# Patient Record
Sex: Male | Born: 1965 | Race: White | Hispanic: No | Marital: Single | Smoking: Current every day smoker
Health system: Southern US, Community
[De-identification: ages and names within clinical notes are randomized; demographics above are authoritative.]

## PROBLEM LIST (undated history)

## (undated) DIAGNOSIS — I10 Essential (primary) hypertension: Secondary | ICD-10-CM

## (undated) DIAGNOSIS — J449 Chronic obstructive pulmonary disease, unspecified: Secondary | ICD-10-CM

---

## 2018-11-23 ENCOUNTER — Inpatient Hospital Stay (HOSPITAL_COMMUNITY)
Admission: EM | Admit: 2018-11-23 | Discharge: 2018-11-26 | DRG: 193 | Disposition: A | Discharge status: Home / Self Care | Payer: Medicaid Other | Attending: Internal Medicine | Admitting: Internal Medicine

## 2018-11-23 ENCOUNTER — Other Ambulatory Visit: Payer: Self-pay

## 2018-11-23 ENCOUNTER — Emergency Department (HOSPITAL_COMMUNITY): Payer: Medicaid Other

## 2018-11-23 DIAGNOSIS — J209 Acute bronchitis, unspecified: Secondary | ICD-10-CM | POA: Diagnosis present

## 2018-11-23 DIAGNOSIS — J9601 Acute respiratory failure with hypoxia: Secondary | ICD-10-CM | POA: Diagnosis present

## 2018-11-23 DIAGNOSIS — R531 Weakness: Secondary | ICD-10-CM | POA: Diagnosis present

## 2018-11-23 DIAGNOSIS — R059 Cough, unspecified: Secondary | ICD-10-CM

## 2018-11-23 DIAGNOSIS — J111 Influenza due to unidentified influenza virus with other respiratory manifestations: Secondary | ICD-10-CM

## 2018-11-23 DIAGNOSIS — Z5900 Homelessness unspecified: Secondary | ICD-10-CM

## 2018-11-23 DIAGNOSIS — Z23 Encounter for immunization: Secondary | ICD-10-CM

## 2018-11-23 DIAGNOSIS — R05 Cough: Secondary | ICD-10-CM

## 2018-11-23 DIAGNOSIS — J101 Influenza due to other identified influenza virus with other respiratory manifestations: Principal | ICD-10-CM | POA: Diagnosis present

## 2018-11-23 DIAGNOSIS — I10 Essential (primary) hypertension: Secondary | ICD-10-CM | POA: Diagnosis present

## 2018-11-23 DIAGNOSIS — J441 Chronic obstructive pulmonary disease with (acute) exacerbation: Secondary | ICD-10-CM

## 2018-11-23 DIAGNOSIS — J449 Chronic obstructive pulmonary disease, unspecified: Secondary | ICD-10-CM | POA: Diagnosis present

## 2018-11-23 DIAGNOSIS — D72819 Decreased white blood cell count, unspecified: Secondary | ICD-10-CM | POA: Diagnosis present

## 2018-11-23 DIAGNOSIS — R740 Nonspecific elevation of levels of transaminase and lactic acid dehydrogenase [LDH]: Secondary | ICD-10-CM | POA: Diagnosis present

## 2018-11-23 DIAGNOSIS — J440 Chronic obstructive pulmonary disease with acute lower respiratory infection: Secondary | ICD-10-CM | POA: Diagnosis present

## 2018-11-23 DIAGNOSIS — F1010 Alcohol abuse, uncomplicated: Secondary | ICD-10-CM | POA: Diagnosis present

## 2018-11-23 DIAGNOSIS — R262 Difficulty in walking, not elsewhere classified: Secondary | ICD-10-CM | POA: Diagnosis present

## 2018-11-23 DIAGNOSIS — Z720 Tobacco use: Secondary | ICD-10-CM

## 2018-11-23 DIAGNOSIS — D6959 Other secondary thrombocytopenia: Secondary | ICD-10-CM | POA: Diagnosis present

## 2018-11-23 HISTORY — DX: Chronic obstructive pulmonary disease, unspecified: J44.9

## 2018-11-23 HISTORY — DX: Essential (primary) hypertension: I10

## 2018-11-23 LAB — BASIC METABOLIC PANEL
ANION GAP: 14 (ref 5–15)
BUN: 18 mg/dL (ref 6–20)
CO2: 19 mmol/L — ABNORMAL LOW (ref 22–32)
Calcium: 8 mg/dL — ABNORMAL LOW (ref 8.9–10.3)
Chloride: 101 mmol/L (ref 98–111)
Creatinine, Ser: 1.03 mg/dL (ref 0.61–1.24)
GFR calc non Af Amer: >60 mL/min (ref >60)
Glucose, Bld: 102 mg/dL — ABNORMAL HIGH (ref 70–99)
Potassium: 3.6 mmol/L (ref 3.5–5.1)
SODIUM: 134 mmol/L — AB (ref 135–145)

## 2018-11-23 LAB — POCT I-STAT EG7
Acid-base deficit: 2 mmol/L (ref 0.0–2.0)
Bicarbonate: 23 mmol/L (ref 20.0–28.0)
Calcium, Ion: 1.05 mmol/L — ABNORMAL LOW (ref 1.15–1.40)
HCT: 36 % — ABNORMAL LOW (ref 39.0–52.0)
Hemoglobin: 12.2 g/dL — ABNORMAL LOW (ref 13.0–17.0)
O2 Saturation: 93 %
PH VEN: 7.367 (ref 7.250–7.430)
Potassium: 3.4 mmol/L — ABNORMAL LOW (ref 3.5–5.1)
Sodium: 136 mmol/L (ref 135–145)
TCO2: 24 mmol/L (ref 22–32)
pCO2, Ven: 40 mmHg — ABNORMAL LOW (ref 44.0–60.0)
pO2, Ven: 69 mmHg — ABNORMAL HIGH (ref 32.0–45.0)

## 2018-11-23 LAB — CBC
HCT: 38.3 % — ABNORMAL LOW (ref 39.0–52.0)
Hemoglobin: 12.4 g/dL — ABNORMAL LOW (ref 13.0–17.0)
MCH: 28.9 pg (ref 26.0–34.0)
MCHC: 32.4 g/dL (ref 30.0–36.0)
MCV: 89.3 fL (ref 80.0–100.0)
NRBC: 0 % (ref 0.0–0.2)
Platelets: 87 10*3/uL — ABNORMAL LOW (ref 150–400)
RBC: 4.29 MIL/uL (ref 4.22–5.81)
RDW: 14.8 % (ref 11.5–15.5)
WBC: 3.3 10*3/uL — AB (ref 4.0–10.5)

## 2018-11-23 MED ORDER — IPRATROPIUM-ALBUTEROL 0.5-2.5 (3) MG/3ML IN SOLN
3.0000 mL | Freq: Once | RESPIRATORY_TRACT | Status: AC
  Administered 2018-11-23: 3 mL via RESPIRATORY_TRACT
  Filled 2018-11-23: qty 3

## 2018-11-23 MED ORDER — PREDNISONE 20 MG PO TABS
60.0000 mg | ORAL_TABLET | Freq: Once | ORAL | Status: AC
  Administered 2018-11-23: 60 mg via ORAL
  Filled 2018-11-23: qty 3

## 2018-11-23 NOTE — ED Notes (Signed)
Bed: JK93 Expected date:  Expected time:  Means of arrival:  Comments: 32M Fever, flu symptoms

## 2018-11-23 NOTE — ED Triage Notes (Signed)
Pt has been at home for the past 3 days with complaints of head congestion, body aches, and cough.  Pt was running a fever of 102 at home.  EMS gave 1000mg  of tylenol

## 2018-11-23 NOTE — ED Provider Notes (Signed)
[facility] Emergency Department Provider Note MRN:  [id number]  Arrival date & time: 11/23/18     Chief Complaint   Influenza   History of Present Illness   Joseph Powers is a 53 y.o. year-old male with history of COPD presenting to the ED with chief complaint of influenza.  Patient reports 1 week of nasal congestion, mild sore throat, diffuse body aches, fever, chills, persistent cough.  Was feeling better yesterday, symptoms returned today, increased shortness of breath, chest soreness with cough.  Denies headache or vision change, no abdominal pain, no vomiting, no diarrhea.  Symptoms are constant, moderate, no exacerbating relieving factors.  Review of Systems  A complete 10 system review of systems was obtained and all systems are negative except as noted in the HPI and PMH.   Patient's Health History   Past medical history: COPD Social history: Smokes cigarettes No family history on file.  Social History   Socioeconomic History  . Marital status: Single    Spouse name: Not on file  . Number of children: Not on file  . Years of education: Not on file  . Highest education level: Not on file  Occupational History  . Not on file  Social Needs  . Financial resource strain: Not on file  . Food insecurity:    Worry: Not on file    Inability: Not on file  . Transportation needs:    Medical: Not on file    Non-medical: Not on file  Tobacco Use  . Smoking status: Not on file  Substance and Sexual Activity  . Alcohol use: Not on file  . Drug use: Not on file  . Sexual activity: Not on file  Lifestyle  . Physical activity:    Days per week: Not on file    Minutes per session: Not on file  . Stress: Not on file  Relationships  . Social connections:    Talks on phone: Not on file    Gets together: Not on file    Attends religious service: Not on file    Active member of club or organization: Not on file    Attends meetings of clubs or  organizations: Not on file    Relationship status: Not on file  . Intimate partner violence:    Fear of current or ex partner: Not on file    Emotionally abused: Not on file    Physically abused: Not on file    Forced sexual activity: Not on file  Other Topics Concern  . Not on file  Social History Narrative  . Not on file     Physical Exam  Vital Signs and Nursing Notes reviewed Vitals:   11/23/18 2253 11/23/18 2300  BP:  140/84  Pulse: 81 76  Resp: (!) 24 (!) 27  Temp:  99.2 F (37.3 C)  SpO2: 92% 93%    CONSTITUTIONAL: Tired-appearing, NAD NEURO:  Alert and oriented x 3, no focal deficits EYES:  eyes equal and reactive ENT/NECK:  no LAD, no JVD CARDIO: Regular rate, well-perfused, normal S1 and S2 PULM:  CTAB no wheezing or rhonchi GI/GU:  normal bowel sounds, non-distended, non-tender MSK/SPINE:  No gross deformities, no edema SKIN:  no rash, atraumatic PSYCH:  Appropriate speech and behavior  Diagnostic and Interventional Summary    EKG Interpretation  Date/Time:  Friday November 23 2018 22:16:04 EST Ventricular Rate:  82 PR Interval:    QRS Duration: 99 QT Interval:  400 QTC Calculation: 468 R  Axis:   79 Text Interpretation:  Sinus rhythm Confirmed by Kennis Carina [phone]) on 11/23/2018 10:49:16 PM      Labs Reviewed  CBC - Abnormal; Notable for the following components:      Result Value   WBC 3.3 (*)    Hemoglobin 12.4 (*)    HCT 38.3 (*)    Platelets 87 (*)    All other components within normal limits  BASIC METABOLIC PANEL - Abnormal; Notable for the following components:   Sodium 134 (*)    CO2 19 (*)    Glucose, Bld 102 (*)    Calcium 8.0 (*)    All other components within normal limits  INFLUENZA PANEL BY PCR (TYPE A & B)    DG Chest 2 View  Final Result      Medications  ipratropium-albuterol (DUONEB) 0.5-2.5 (3) MG/3ML nebulizer solution 3 mL (3 mLs Nebulization Given 11/23/18 2153)  predniSONE (DELTASONE) tablet 60 mg (60 mg Oral  Given 11/23/18 2153)     Procedures Critical Care  ED Course and Medical Decision Making  I have reviewed the triage vital signs and the nursing notes.  Pertinent labs & imaging results that were available during my care of the patient were reviewed by me and considered in my medical decision making (see below for details).  Considering secondary bacterial pneumonia in the setting of likely influenza.  Basic labs, chest x-ray pending.  Labs largely unremarkable, chest x-ray is normal.  Patient desaturated to 88% during ambulation, and now is persistently in the high 80s on room air even at rest.  Now requiring 2 L nasal cannula, new oxygen requirement.  Patient also continues to appear somnolent, will obtain VBG to evaluate for CO2 retention, will admit to hospitalist service.  Elmer Sow. Pilar Plate, MD [facility] Emergency Medicine [facility] [email or web address]  Final Clinical Impressions(s) / ED Diagnoses     ICD-10-CM   1. Influenza J11.1   2. Cough R05 DG Chest 2 View    DG Chest 2 View  3. COPD exacerbation [facility]) J44.1     ED Discharge Orders    None         Sabas Sous, MD 11/23/18 [phone]

## 2018-11-24 ENCOUNTER — Other Ambulatory Visit: Payer: Self-pay

## 2018-11-24 ENCOUNTER — Encounter (HOSPITAL_COMMUNITY): Payer: Self-pay

## 2018-11-24 DIAGNOSIS — Z720 Tobacco use: Secondary | ICD-10-CM

## 2018-11-24 DIAGNOSIS — J111 Influenza due to unidentified influenza virus with other respiratory manifestations: Secondary | ICD-10-CM

## 2018-11-24 DIAGNOSIS — J209 Acute bronchitis, unspecified: Secondary | ICD-10-CM

## 2018-11-24 DIAGNOSIS — R740 Nonspecific elevation of levels of transaminase and lactic acid dehydrogenase [LDH]: Secondary | ICD-10-CM

## 2018-11-24 DIAGNOSIS — D696 Thrombocytopenia, unspecified: Secondary | ICD-10-CM

## 2018-11-24 DIAGNOSIS — F1010 Alcohol abuse, uncomplicated: Secondary | ICD-10-CM

## 2018-11-24 LAB — BASIC METABOLIC PANEL
ANION GAP: 8 (ref 5–15)
BUN: 18 mg/dL (ref 6–20)
CO2: 25 mmol/L (ref 22–32)
Calcium: 8.4 mg/dL — ABNORMAL LOW (ref 8.9–10.3)
Chloride: 104 mmol/L (ref 98–111)
Creatinine, Ser: 0.91 mg/dL (ref 0.61–1.24)
GFR calc Af Amer: >60 mL/min (ref >60)
GFR calc non Af Amer: >60 mL/min (ref >60)
GLUCOSE: 112 mg/dL — AB (ref 70–99)
Potassium: 4.1 mmol/L (ref 3.5–5.1)
Sodium: 137 mmol/L (ref 135–145)

## 2018-11-24 LAB — HEPATIC FUNCTION PANEL
ALT: 79 U/L — ABNORMAL HIGH (ref 0–44)
AST: 124 U/L — ABNORMAL HIGH (ref 15–41)
Albumin: 3.8 g/dL (ref 3.5–5.0)
Alkaline Phosphatase: 48 U/L (ref 38–126)
Bilirubin, Direct: 0.2 mg/dL (ref 0.0–0.2)
Indirect Bilirubin: 0.5 mg/dL (ref 0.3–0.9)
TOTAL PROTEIN: 6.7 g/dL (ref 6.5–8.1)
Total Bilirubin: 0.7 mg/dL (ref 0.3–1.2)

## 2018-11-24 LAB — CBC WITH DIFFERENTIAL/PLATELET
Abs Immature Granulocytes: 0.01 10*3/uL (ref 0.00–0.07)
Basophils Absolute: 0 10*3/uL (ref 0.0–0.1)
Basophils Relative: 0 %
Eosinophils Absolute: 0 10*3/uL (ref 0.0–0.5)
Eosinophils Relative: 0 %
HCT: 39.8 % (ref 39.0–52.0)
HEMOGLOBIN: 12.3 g/dL — AB (ref 13.0–17.0)
Immature Granulocytes: 0 %
Lymphocytes Relative: 25 %
Lymphs Abs: 0.6 10*3/uL — ABNORMAL LOW (ref 0.7–4.0)
MCH: 28.4 pg (ref 26.0–34.0)
MCHC: 30.9 g/dL (ref 30.0–36.0)
MCV: 91.9 fL (ref 80.0–100.0)
MONOS PCT: 19 %
Monocytes Absolute: 0.4 10*3/uL (ref 0.1–1.0)
Neutro Abs: 1.3 10*3/uL — ABNORMAL LOW (ref 1.7–7.7)
Neutrophils Relative %: 56 %
Platelets: 78 10*3/uL — ABNORMAL LOW (ref 150–400)
RBC: 4.33 MIL/uL (ref 4.22–5.81)
RDW: 15 % (ref 11.5–15.5)
WBC: 2.3 10*3/uL — ABNORMAL LOW (ref 4.0–10.5)
nRBC: 0 % (ref 0.0–0.2)

## 2018-11-24 LAB — TROPONIN I: Troponin I: <0.03 ng/mL (ref <0.03)

## 2018-11-24 LAB — RAPID URINE DRUG SCREEN, HOSP PERFORMED
AMPHETAMINES: NOT DETECTED
Barbiturates: NOT DETECTED
Benzodiazepines: NOT DETECTED
Cocaine: NOT DETECTED
Opiates: NOT DETECTED
Tetrahydrocannabinol: NOT DETECTED

## 2018-11-24 LAB — MAGNESIUM: Magnesium: 2.4 mg/dL (ref 1.7–2.4)

## 2018-11-24 LAB — CK: Total CK: 416 U/L — ABNORMAL HIGH (ref 49–397)

## 2018-11-24 LAB — INFLUENZA PANEL BY PCR (TYPE A & B)
Influenza A By PCR: POSITIVE — AB
Influenza B By PCR: NEGATIVE

## 2018-11-24 MED ORDER — IPRATROPIUM BROMIDE 0.02 % IN SOLN: 0.5000 mg | RESPIRATORY_TRACT | Status: DC

## 2018-11-24 MED ORDER — ACETAMINOPHEN 650 MG RE SUPP: 650.0000 mg | Freq: Four times a day (QID) | RECTAL | Status: DC | PRN

## 2018-11-24 MED ORDER — ALBUTEROL SULFATE (2.5 MG/3ML) 0.083% IN NEBU: 2.5000 mg | INHALATION_SOLUTION | RESPIRATORY_TRACT | Status: DC

## 2018-11-24 MED ORDER — ALBUTEROL SULFATE (2.5 MG/3ML) 0.083% IN NEBU: 2.5000 mg | INHALATION_SOLUTION | RESPIRATORY_TRACT | Status: DC | PRN

## 2018-11-24 MED ORDER — IPRATROPIUM-ALBUTEROL 0.5-2.5 (3) MG/3ML IN SOLN
3.0000 mL | RESPIRATORY_TRACT | Status: DC
  Administered 2018-11-24: 3 mL via RESPIRATORY_TRACT
  Filled 2018-11-24: qty 3

## 2018-11-24 MED ORDER — FOLIC ACID 1 MG PO TABS
1.0000 mg | ORAL_TABLET | Freq: Every day | ORAL | Status: DC
  Administered 2018-11-24 – 2018-11-26 (×3): 1 mg via ORAL
  Filled 2018-11-24 (×4): qty 1

## 2018-11-24 MED ORDER — IPRATROPIUM-ALBUTEROL 0.5-2.5 (3) MG/3ML IN SOLN
3.0000 mL | Freq: Three times a day (TID) | RESPIRATORY_TRACT | Status: DC
  Administered 2018-11-24 – 2018-11-26 (×8): 3 mL via RESPIRATORY_TRACT
  Filled 2018-11-24 (×8): qty 3

## 2018-11-24 MED ORDER — ENSURE ENLIVE PO LIQD
237.0000 mL | Freq: Two times a day (BID) | ORAL | Status: DC
  Administered 2018-11-24 – 2018-11-26 (×5): 237 mL via ORAL

## 2018-11-24 MED ORDER — INFLUENZA VAC SPLIT QUAD 0.5 ML IM SUSY
0.5000 mL | PREFILLED_SYRINGE | INTRAMUSCULAR | Status: DC
  Filled 2018-11-24 (×2): qty 0.5

## 2018-11-24 MED ORDER — VITAMIN B-1 100 MG PO TABS
100.0000 mg | ORAL_TABLET | Freq: Every day | ORAL | Status: DC
  Administered 2018-11-24 – 2018-11-26 (×3): 100 mg via ORAL
  Filled 2018-11-24 (×4): qty 1

## 2018-11-24 MED ORDER — ADULT MULTIVITAMIN W/MINERALS CH
1.0000 | ORAL_TABLET | Freq: Every day | ORAL | Status: DC
  Administered 2018-11-24 – 2018-11-26 (×3): 1 via ORAL
  Filled 2018-11-24 (×4): qty 1

## 2018-11-24 MED ORDER — NICOTINE 21 MG/24HR TD PT24
21.0000 mg | MEDICATED_PATCH | Freq: Every day | TRANSDERMAL | Status: DC
  Administered 2018-11-24 – 2018-11-26 (×3): 21 mg via TRANSDERMAL
  Filled 2018-11-24 (×4): qty 1

## 2018-11-24 MED ORDER — THIAMINE HCL 100 MG/ML IJ SOLN
100.0000 mg | Freq: Every day | INTRAMUSCULAR | Status: DC
  Filled 2018-11-24: qty 2

## 2018-11-24 MED ORDER — IPRATROPIUM-ALBUTEROL 0.5-2.5 (3) MG/3ML IN SOLN: 3.0000 mL | Freq: Four times a day (QID) | RESPIRATORY_TRACT | Status: DC

## 2018-11-24 MED ORDER — ACETAMINOPHEN 325 MG PO TABS
650.0000 mg | ORAL_TABLET | Freq: Four times a day (QID) | ORAL | Status: DC | PRN
  Administered 2018-11-25 (×2): 650 mg via ORAL
  Filled 2018-11-24 (×2): qty 2

## 2018-11-24 MED ORDER — ENOXAPARIN SODIUM 40 MG/0.4ML ~~LOC~~ SOLN
40.0000 mg | SUBCUTANEOUS | Status: DC
  Administered 2018-11-24 – 2018-11-26 (×3): 40 mg via SUBCUTANEOUS
  Filled 2018-11-24 (×4): qty 0.4

## 2018-11-24 MED ORDER — LORAZEPAM 2 MG/ML IJ SOLN: 1.0000 mg | Freq: Four times a day (QID) | INTRAMUSCULAR | Status: DC | PRN

## 2018-11-24 MED ORDER — OSELTAMIVIR PHOSPHATE 75 MG PO CAPS
75.0000 mg | ORAL_CAPSULE | Freq: Two times a day (BID) | ORAL | Status: DC
  Administered 2018-11-24 – 2018-11-26 (×6): 75 mg via ORAL
  Filled 2018-11-24 (×6): qty 1

## 2018-11-24 MED ORDER — LORAZEPAM 1 MG PO TABS
1.0000 mg | ORAL_TABLET | Freq: Four times a day (QID) | ORAL | Status: DC | PRN
  Administered 2018-11-24 – 2018-11-25 (×5): 1 mg via ORAL
  Filled 2018-11-24 (×5): qty 1

## 2018-11-24 MED ORDER — ONDANSETRON HCL 4 MG PO TABS: 4.0000 mg | ORAL_TABLET | Freq: Four times a day (QID) | ORAL | Status: DC | PRN

## 2018-11-24 MED ORDER — SODIUM CHLORIDE 0.9 % IV SOLN
INTRAVENOUS | Status: AC
  Administered 2018-11-24 (×3): via INTRAVENOUS

## 2018-11-24 MED ORDER — ONDANSETRON HCL 4 MG/2ML IJ SOLN: 4.0000 mg | Freq: Four times a day (QID) | INTRAMUSCULAR | Status: DC | PRN

## 2018-11-24 MED ORDER — GUAIFENESIN 100 MG/5ML PO SOLN
5.0000 mL | ORAL | Status: DC | PRN
  Administered 2018-11-24 – 2018-11-25 (×3): 100 mg via ORAL
  Filled 2018-11-24: qty 100
  Filled 2018-11-24 (×2): qty 10

## 2018-11-24 NOTE — Progress Notes (Signed)
PROGRESS NOTE    Joseph Powers  MVH:[id number] DOB: October 08, 1965 DOA: 11/23/2018 PCP: Patient, No Pcp Per   Chief complaint: Generalized body aches   Brief Narrative:   Joseph Powers is a 53 y.o. male with history of tobacco and alcohol abuse and history of COPD and hypertension per the patient not on any medication has been having shortness of breath and wheezing generalized body ache difficulty ambulating over the last 1 week.  Had subjective feeling of fever chills.  Feeling difficult to ambulate because of generalized pain.  ED Course: In the ER patient was found to be wheezing and was placed on nebulizer treatment.  Chest x-ray was unremarkable EKG showed normal sinus rhythm.  Patient was very lethargic and venous blood gas did not show any carbon oxide retention.  Influenza a was positive and patient started on Tamiflu and admitted for further observation given the generalized presentation of weakness unable to ambulate with wheezing.   Assessment & Plan:   Active Problems:   Acute bronchitis   Influenza   Acute hypoxic respiratory failure Influenza A infection Patient presenting with generalized body aches, shortness of breath.  Was afebrile, without leukocytosis.  Chest x-ray negative for acute disease process.  Rapid flu positive for influenza type A.  Was requiring supplemental oxygen to maintain adequate SPO2.  History of COPD, current tobacco abuse not on any home medications.  Not on home oxygen. --Continue Tamiflu 75 mg p.o. twice daily --RT consulted, nebs as needed --Continue to titrate supplemental oxygen to maintain SPO2 greater than 92% --Supportive care, IV fluid hydration  EtOH abuse Patient reports drinks fifth of vodka on a daily basis.  Does report previous withdrawal symptoms to include tremors. --CIWAA protocol --UDS pending --Thiamine, folate, multivitamin --Continue to monitor on telemetry --Social work consult for substance abuse --Counseled on need  for total alcohol cessation --Check HIV status  Tobacco abuse disorder Counseled on need for cessation. --Nicotine patch while inpatient  Homelessness Patient reports recently moved from [facility].  Lives in shelters occasionally otherwise lives on the streets. --Social work consult for assistance  Thrombocytopenia Transaminitis Platelet count 88.  AST 124, ALT 79.  No signs of active bleeding.  Etiology likely from underlying alcohol abuse. --Continue to monitor CMP and CBC daily --EtOH cessation advised   DVT prophylaxis: Lovenox Code Status: Full Code Family Communication: None Disposition Plan: Continue inpatient hospitalization for acute hypoxic respiratory failure, on Tamiflu for active influenza type a; PT evaluation, social work evaluation, further depending on clinical course   Consultants:   none  Procedures:   none  Antimicrobials:   Tamiflu 2/29>>>   Subjective: Patient seen and examined at bedside, resting comfortably.  States continues with diffuse body aches, generalized weakness/fatigue.  Reports recently moved from [facility] with a friend, currently homeless living in shelters occasionally.  Continues to abuse significant amounts of alcohol and tobacco.  Reports history of tremors with alcohol withdrawal in the past.  Multiple sick contacts.  No other complaints this time.  Denies visual changes, no chest pain, no palpitations, no abdominal pain, no fever/chills/night sweats, no issues with bowel/bladder function, no paresthesias, no congestion.  No acute events overnight per nursing staff.  Objective: Vitals:   11/24/18 0117 11/24/18 0348 11/24/18 0618 11/24/18 0724  BP: (!) 155/87  134/90   Pulse: 76  (!) 54   Resp: 20  16   Temp: 98.1 F (36.7 C)  98 F (36.7 C)   TempSrc: Oral  Oral  SpO2: 98% 98% 98% 98%  Weight:      Height:        Intake/Output Summary (Last 24 hours) at 11/24/2018 0927 Last data filed at 11/24/2018 0344 Gross per 24  hour  Intake 0 ml  Output 575 ml  Net -575 ml   Filed Weights   11/23/18 2125  Weight: 90.7 kg    Examination:  General exam: Appears calm and comfortable  Respiratory system: Lung sounds slightly decreased bilateral bases, no crackles, mild late expiratory wheezing, normal respiratory effort, on 2.5 L nasal cannula. Cardiovascular system: S1 & S2 heard, RRR. No JVD, murmurs, rubs, gallops or clicks. No pedal edema. Gastrointestinal system: Abdomen is nondistended, soft and nontender. No organomegaly or masses felt. Normal bowel sounds heard. Central nervous system: Alert and oriented. No focal neurological deficits. Extremities: Symmetric 5 x 5 power. Skin: No rashes, lesions or ulcers Psychiatry: Judgement and insight appear normal. Mood & affect appropriate.     Data Reviewed: I have personally reviewed following labs and imaging studies  CBC: Recent Labs  Lab 11/23/18 2201 11/23/18 2334 11/24/18 0308  WBC 3.3*  --  2.3*  NEUTROABS  --   --  1.3*  HGB 12.4* 12.2* 12.3*  HCT 38.3* 36.0* 39.8  MCV 89.3  --  91.9  PLT 87*  --  78*   Basic Metabolic Panel: Recent Labs  Lab 11/23/18 2201 11/23/18 2334 11/24/18 0308  NA 134* 136 137  K 3.6 3.4* 4.1  CL 101  --  104  CO2 19*  --  25  GLUCOSE 102*  --  112*  BUN 18  --  18  CREATININE 1.03  --  0.91  CALCIUM 8.0*  --  8.4*  MG  --   --  2.4   GFR: Estimated Creatinine Clearance: 101.9 mL/min (by C-G formula based on SCr of 0.91 mg/dL). Liver Function Tests: Recent Labs  Lab 11/24/18 0308  AST 124*  ALT 79*  ALKPHOS 48  BILITOT 0.7  PROT 6.7  ALBUMIN 3.8   No results for input(s): LIPASE, AMYLASE in the last 168 hours. No results for input(s): AMMONIA in the last 168 hours. Coagulation Profile: No results for input(s): INR, PROTIME in the last 168 hours. Cardiac Enzymes: Recent Labs  Lab 11/24/18 0308 11/24/18 0740  CKTOTAL 416*  --   TROPONINI  --  <0.03   BNP (last 3 results) No results for  input(s): PROBNP in the last 8760 hours. HbA1C: No results for input(s): HGBA1C in the last 72 hours. CBG: No results for input(s): GLUCAP in the last 168 hours. Lipid Profile: No results for input(s): CHOL, HDL, LDLCALC, TRIG, CHOLHDL, LDLDIRECT in the last 72 hours. Thyroid Function Tests: No results for input(s): TSH, T4TOTAL, FREET4, T3FREE, THYROIDAB in the last 72 hours. Anemia Panel: No results for input(s): VITAMINB12, FOLATE, FERRITIN, TIBC, IRON, RETICCTPCT in the last 72 hours. Sepsis Labs: No results for input(s): PROCALCITON, LATICACIDVEN in the last 168 hours.  No results found for this or any previous visit (from the past 240 hour(s)).       Radiology Studies: Dg Chest 2 View  Result Date: 11/23/2018 CLINICAL DATA:  53 year old male with cough. EXAM: CHEST - 2 VIEW COMPARISON:  None. FINDINGS: Background of COPD and mild chronic bronchitic changes. No focal consolidation, pleural effusion or pneumothorax. The cardiac silhouette is within normal limits. No acute osseous pathology. Lower cervical ACDF. IMPRESSION: No active cardiopulmonary disease. Electronically Signed   By: Elgie Collard  M.D.   On: 11/23/2018 22:06        Scheduled Meds: . enoxaparin (LOVENOX) injection  40 mg Subcutaneous Q24H  . feeding supplement (ENSURE ENLIVE)  237 mL Oral BID BM  . folic acid  1 mg Oral Daily  . ipratropium-albuterol  3 mL Nebulization TID  . multivitamin with minerals  1 tablet Oral Daily  . nicotine  21 mg Transdermal Daily  . oseltamivir  75 mg Oral BID  . thiamine  100 mg Oral Daily   Or  . thiamine  100 mg Intravenous Daily   Continuous Infusions: . sodium chloride 125 mL/hr at 11/24/18 0326     LOS: 0 days    Time spent: 32 minutes    Alvira Philips [place], DO [facility] Pager [phone]  If 7PM-7AM, please contact night-coverage [email or web address] Password [email or web address] 11/24/2018, 9:27 AM

## 2018-11-24 NOTE — ED Notes (Signed)
ED TO INPATIENT HANDOFF REPORT  Name/Age/Gender Joseph Powers 53 y.o. male  Code Status   Home/SNF/Other Home  Chief Complaint Flu like symptoms  Level of Care/Admitting Diagnosis ED Disposition    ED Disposition Condition Comment   Admit  Hospital Area: [facility] [[id number]]  Level of Care: Telemetry [5]  Admit to tele based on following criteria: Monitor for Ischemic changes  Diagnosis: Acute bronchitis [466.0.ICD-9-CM]  Admitting Physician: Eduard Clos [id number]  Attending Physician: Eduard Clos [phone]  PT Class (Do Not Modify): Observation [104]  PT Acc Code (Do Not Modify): Observation [10022]       Medical History No past medical history on file.  Allergies No Known Allergies  IV Location/Drains/Wounds Patient Lines/Drains/Airways Status   Active Line/Drains/Airways    Name:   Placement date:   Placement time:   Site:   Days:   Peripheral IV 11/24/18 Right Antecubital   11/24/18    0015    Antecubital   less than 1          Labs/Imaging Results for orders placed or performed during the hospital encounter of 11/23/18 (from the past 48 hour(s))  CBC     Status: Abnormal   Collection Time: 11/23/18 10:01 PM  Result Value Ref Range   WBC 3.3 (L) 4.0 - 10.5 K/uL   RBC 4.29 4.22 - 5.81 MIL/uL   Hemoglobin 12.4 (L) 13.0 - 17.0 g/dL   HCT 25.6 (L) 38.9 - 37.3 %   MCV 89.3 80.0 - 100.0 fL   MCH 28.9 26.0 - 34.0 pg   MCHC 32.4 30.0 - 36.0 g/dL   RDW 42.8 76.8 - 11.5 %   Platelets 87 (L) 150 - 400 K/uL    Comment: Immature Platelet Fraction may be clinically indicated, consider ordering this additional test [id number]    nRBC 0.0 0.0 - 0.2 %    Comment: Performed at [facility], [address] [place], [place] [address]  Basic metabolic panel     Status: Abnormal   Collection Time: 11/23/18 10:01 PM  Result Value Ref Range   Sodium 134 (L) 135 - 145 mmol/L   Potassium 3.6 3.5 - 5.1 mmol/L   Chloride 101 98 - 111 mmol/L   CO2 19 (L) 22 - 32 mmol/L   Glucose, Bld 102 (H) 70 - 99 mg/dL   BUN 18 6 - 20 mg/dL   Creatinine, Ser 3.84 0.61 - 1.24 mg/dL   Calcium 8.0 (L) 8.9 - 10.3 mg/dL   GFR calc non Af Amer >60 >60 mL/min   GFR calc Af Amer >60 >60 mL/min   Anion gap 14 5 - 15    Comment: Performed at [facility], [address], [place], [place] [address]  Influenza panel by PCR (type A & B)     Status: Abnormal   Collection Time: 11/23/18 11:32 PM  Result Value Ref Range   Influenza A By PCR POSITIVE (A) NEGATIVE   Influenza B By PCR NEGATIVE NEGATIVE    Comment: (NOTE) The Xpert Xpress Flu assay is intended as an aid in the diagnosis of  influenza and should not be used as a sole basis for treatment.  This  assay is FDA approved for nasopharyngeal swab specimens only. Nasal  washings and aspirates are unacceptable for Xpert Xpress Flu testing. Performed at [facility], [address], [place], [place] [address]   POCT I-Stat EG7     Status: Abnormal  Collection Time: 11/23/18 11:34 PM  Result Value Ref Range   pH, Ven 7.367 7.250 - 7.430   pCO2, Ven 40.0 (L) 44.0 - 60.0 mmHg   pO2, Ven 69.0 (H) 32.0 - 45.0 mmHg   Bicarbonate 23.0 20.0 - 28.0 mmol/L   TCO2 24 22 - 32 mmol/L   O2 Saturation 93.0 %   Acid-base deficit 2.0 0.0 - 2.0 mmol/L   Sodium 136 135 - 145 mmol/L   Potassium 3.4 (L) 3.5 - 5.1 mmol/L   Calcium, Ion 1.05 (L) 1.15 - 1.40 mmol/L   HCT 36.0 (L) 39.0 - 52.0 %   Hemoglobin 12.2 (L) 13.0 - 17.0 g/dL   Patient temperature HIDE    Sample type VENOUS    Dg Chest 2 View  Result Date: 11/23/2018 CLINICAL DATA:  53 year old male with cough. EXAM: CHEST - 2 VIEW COMPARISON:  None. FINDINGS: Background of COPD and mild chronic bronchitic changes. No focal consolidation, pleural effusion or pneumothorax. The cardiac silhouette is within normal limits. No acute osseous pathology. Lower cervical ACDF. IMPRESSION: No active  cardiopulmonary disease. Electronically Signed   By: Elgie Collard M.D.   On: 11/23/2018 22:06    Pending Labs Unresulted Labs (From admission, onward)   None      Vitals/Pain Today's Vitals   11/24/18 0030 11/24/18 0048 11/24/18 0049 11/24/18 0054  BP: 139/90     Pulse: 72 73  73  Resp: (!) 28 (!) 24  (!) 24  Temp:      TempSrc:      SpO2: 92% 94%  94%  Weight:      Height:      PainSc:   Asleep     Isolation Precautions Droplet precaution  Medications Medications  ipratropium-albuterol (DUONEB) 0.5-2.5 (3) MG/3ML nebulizer solution 3 mL (3 mLs Nebulization Given 11/23/18 2153)  predniSONE (DELTASONE) tablet 60 mg (60 mg Oral Given 11/23/18 2153)    Mobility walks

## 2018-11-24 NOTE — H&P (Signed)
History and Physical    Joseph Powers OAC:[id number] DOB: [birth year] DOA: 11/23/2018  PCP: Patient, No Pcp Per  Patient coming from: Home.  Chief Complaint: Generalized body ache.  HPI: Joseph Powers is a 53 y.o. male with history of tobacco and alcohol abuse and history of COPD and hypertension per the patient not on any medication has been having shortness of breath and wheezing generalized body ache difficulty ambulating over the last 1 week.  Had subjective feeling of fever chills.  Feeling difficult to ambulate because of generalized pain.  ED Course: In the ER patient was found to be wheezing and was placed on nebulizer treatment.  Chest x-ray was unremarkable EKG showed normal sinus rhythm.  Patient was very lethargic and venous blood gas did not show any carbon oxide retention.  Influenza a was positive and patient started on Tamiflu and admitted for further observation given the generalized presentation of weakness unable to ambulate with wheezing.  Review of Systems: As per HPI, rest all negative.   Past Medical History:  Diagnosis Date  . COPD (chronic obstructive pulmonary disease) (HCC)   . Hypertension     History reviewed. No pertinent surgical history.   reports that he has been smoking. He has never used smokeless tobacco. He reports current alcohol use. No history on file for drug.  No Known Allergies  Family History  Problem Relation Age of Onset  . Diabetes Mellitus II Neg Hx     Prior to Admission medications   Not on File    Physical Exam: Vitals:   11/24/18 0030 11/24/18 0048 11/24/18 0054 11/24/18 0117  BP: 139/90   (!) 155/87  Pulse: 72 73 73 76  Resp: (!) 28 (!) 24 (!) 24 20  Temp:    98.1 F (36.7 C)  TempSrc:    Oral  SpO2: 92% 94% 94% 98%  Weight:      Height:          Constitutional: Moderately built and nourished. Vitals:   11/24/18 0030 11/24/18 0048 11/24/18 0054 11/24/18 0117  BP: 139/90   (!) 155/87  Pulse: 72 73 73 76    Resp: (!) 28 (!) 24 (!) 24 20  Temp:    98.1 F (36.7 C)  TempSrc:    Oral  SpO2: 92% 94% 94% 98%  Weight:      Height:       Eyes: Anicteric no pallor. ENMT: No discharge from the ears eyes nose or mouth. Neck: No mass felt.  No neck rigidity. Respiratory: Mild expiratory wheeze no crepitations. Cardiovascular: S1-S2 heard. Abdomen: Soft nontender bowel sounds present. Musculoskeletal: No edema.  No joint effusion. Skin: No rash. Neurologic: Patient is very lethargic but answers questions appropriately and is oriented time place and person.  Moves all extremities but has some difficulty moving extremities. Psychiatric: Mildly lethargic but oriented to time place and person.   Labs on Admission: I have personally reviewed following labs and imaging studies  CBC: Recent Labs  Lab 11/23/18 2201 11/23/18 2334  WBC 3.3*  --   HGB 12.4* 12.2*  HCT 38.3* 36.0*  MCV 89.3  --   PLT 87*  --    Basic Metabolic Panel: Recent Labs  Lab 11/23/18 2201 11/23/18 2334  NA 134* 136  K 3.6 3.4*  CL 101  --   CO2 19*  --   GLUCOSE 102*  --   BUN 18  --   CREATININE 1.03  --   CALCIUM 8.0*  --  GFR: Estimated Creatinine Clearance: 90 mL/min (by C-G formula based on SCr of 1.03 mg/dL). Liver Function Tests: No results for input(s): AST, ALT, ALKPHOS, BILITOT, PROT, ALBUMIN in the last 168 hours. No results for input(s): LIPASE, AMYLASE in the last 168 hours. No results for input(s): AMMONIA in the last 168 hours. Coagulation Profile: No results for input(s): INR, PROTIME in the last 168 hours. Cardiac Enzymes: No results for input(s): CKTOTAL, CKMB, CKMBINDEX, TROPONINI in the last 168 hours. BNP (last 3 results) No results for input(s): PROBNP in the last 8760 hours. HbA1C: No results for input(s): HGBA1C in the last 72 hours. CBG: No results for input(s): GLUCAP in the last 168 hours. Lipid Profile: No results for input(s): CHOL, HDL, LDLCALC, TRIG, CHOLHDL, LDLDIRECT  in the last 72 hours. Thyroid Function Tests: No results for input(s): TSH, T4TOTAL, FREET4, T3FREE, THYROIDAB in the last 72 hours. Anemia Panel: No results for input(s): VITAMINB12, FOLATE, FERRITIN, TIBC, IRON, RETICCTPCT in the last 72 hours. Urine analysis: No results found for: COLORURINE, APPEARANCEUR, LABSPEC, PHURINE, GLUCOSEU, HGBUR, BILIRUBINUR, KETONESUR, PROTEINUR, UROBILINOGEN, NITRITE, LEUKOCYTESUR Sepsis Labs: @LABRCNTIP (procalcitonin:4,lacticidven:4) )No results found for this or any previous visit (from the past 240 hour(s)).   Radiological Exams on Admission: Dg Chest 2 View  Result Date: 11/23/2018 CLINICAL DATA:  53 year old male with cough. EXAM: CHEST - 2 VIEW COMPARISON:  None. FINDINGS: Background of COPD and mild chronic bronchitic changes. No focal consolidation, pleural effusion or pneumothorax. The cardiac silhouette is within normal limits. No acute osseous pathology. Lower cervical ACDF. IMPRESSION: No active cardiopulmonary disease. Electronically Signed   By: Elgie Collard M.D.   On: 11/23/2018 22:06    EKG: Independently reviewed.  Normal sinus rhythm.  Assessment/Plan Active Problems:   Acute bronchitis   Influenza    1. Influenza with bronchitis on Tamiflu and nebulizer treatment. 2. Generalized body ache and difficulty ambulating and lethargy could be from flu.  Check CK level.  Check urine drug screen. 3. Tobacco and alcohol abuse.  On CIWA protocol.  Tobacco cessation counseling requested. 4. History of hypertension COPD per the patient.  Not on any medication at this time.  Closely follow blood pressure trends.  On PRN nebulizer. 5. Leukopenia and thrombocytopenia could be from alcoholism.  Follow CBC.   DVT prophylaxis: Lovenox.  Noted patient does have some thrombocytopenia. Code Status: Full code. Family Communication: Discussed with patient. Disposition Plan: Home. Consults called: None. Admission status: Observation.   Eduard Clos MD [facility] Pager [phone].  If 7PM-7AM, please contact night-coverage [email or web address] Password [email or web address]  11/24/2018, 2:42 AM

## 2018-11-24 NOTE — Plan of Care (Signed)
Pt very weak and refusing anything to eat or drink.

## 2018-11-25 DIAGNOSIS — J9601 Acute respiratory failure with hypoxia: Secondary | ICD-10-CM | POA: Diagnosis present

## 2018-11-25 DIAGNOSIS — J101 Influenza due to other identified influenza virus with other respiratory manifestations: Secondary | ICD-10-CM | POA: Diagnosis present

## 2018-11-25 DIAGNOSIS — D72819 Decreased white blood cell count, unspecified: Secondary | ICD-10-CM | POA: Diagnosis present

## 2018-11-25 DIAGNOSIS — J449 Chronic obstructive pulmonary disease, unspecified: Secondary | ICD-10-CM | POA: Diagnosis present

## 2018-11-25 DIAGNOSIS — R262 Difficulty in walking, not elsewhere classified: Secondary | ICD-10-CM | POA: Diagnosis present

## 2018-11-25 DIAGNOSIS — Z23 Encounter for immunization: Secondary | ICD-10-CM | POA: Diagnosis not present

## 2018-11-25 DIAGNOSIS — Z5900 Homelessness unspecified: Secondary | ICD-10-CM

## 2018-11-25 DIAGNOSIS — J209 Acute bronchitis, unspecified: Secondary | ICD-10-CM | POA: Diagnosis present

## 2018-11-25 DIAGNOSIS — I10 Essential (primary) hypertension: Secondary | ICD-10-CM | POA: Diagnosis present

## 2018-11-25 DIAGNOSIS — Z720 Tobacco use: Secondary | ICD-10-CM | POA: Diagnosis present

## 2018-11-25 DIAGNOSIS — J440 Chronic obstructive pulmonary disease with acute lower respiratory infection: Secondary | ICD-10-CM | POA: Diagnosis present

## 2018-11-25 DIAGNOSIS — J111 Influenza due to unidentified influenza virus with other respiratory manifestations: Secondary | ICD-10-CM | POA: Diagnosis not present

## 2018-11-25 DIAGNOSIS — R531 Weakness: Secondary | ICD-10-CM | POA: Diagnosis present

## 2018-11-25 DIAGNOSIS — D6959 Other secondary thrombocytopenia: Secondary | ICD-10-CM | POA: Diagnosis present

## 2018-11-25 DIAGNOSIS — R05 Cough: Secondary | ICD-10-CM | POA: Diagnosis not present

## 2018-11-25 DIAGNOSIS — J205 Acute bronchitis due to respiratory syncytial virus: Secondary | ICD-10-CM | POA: Diagnosis not present

## 2018-11-25 DIAGNOSIS — R740 Nonspecific elevation of levels of transaminase and lactic acid dehydrogenase [LDH]: Secondary | ICD-10-CM | POA: Diagnosis present

## 2018-11-25 DIAGNOSIS — F1010 Alcohol abuse, uncomplicated: Secondary | ICD-10-CM | POA: Diagnosis present

## 2018-11-25 LAB — COMPREHENSIVE METABOLIC PANEL
ALT: 61 U/L — ABNORMAL HIGH (ref 0–44)
AST: 73 U/L — ABNORMAL HIGH (ref 15–41)
Albumin: 3.3 g/dL — ABNORMAL LOW (ref 3.5–5.0)
Alkaline Phosphatase: 46 U/L (ref 38–126)
Anion gap: 7 (ref 5–15)
BUN: 16 mg/dL (ref 6–20)
CO2: 27 mmol/L (ref 22–32)
Calcium: 8 mg/dL — ABNORMAL LOW (ref 8.9–10.3)
Chloride: 106 mmol/L (ref 98–111)
Creatinine, Ser: 0.82 mg/dL (ref 0.61–1.24)
GFR calc Af Amer: >60 mL/min (ref >60)
GFR calc non Af Amer: >60 mL/min (ref >60)
Glucose, Bld: 112 mg/dL — ABNORMAL HIGH (ref 70–99)
Potassium: 3.9 mmol/L (ref 3.5–5.1)
SODIUM: 140 mmol/L (ref 135–145)
Total Bilirubin: 0.4 mg/dL (ref 0.3–1.2)
Total Protein: 6.1 g/dL — ABNORMAL LOW (ref 6.5–8.1)

## 2018-11-25 LAB — CBC
HCT: 39.6 % (ref 39.0–52.0)
Hemoglobin: 12.4 g/dL — ABNORMAL LOW (ref 13.0–17.0)
MCH: 28.6 pg (ref 26.0–34.0)
MCHC: 31.3 g/dL (ref 30.0–36.0)
MCV: 91.5 fL (ref 80.0–100.0)
Platelets: 68 10*3/uL — ABNORMAL LOW (ref 150–400)
RBC: 4.33 MIL/uL (ref 4.22–5.81)
RDW: 14.8 % (ref 11.5–15.5)
WBC: 3.6 10*3/uL — AB (ref 4.0–10.5)
nRBC: 0 % (ref 0.0–0.2)

## 2018-11-25 LAB — MAGNESIUM: Magnesium: 2.2 mg/dL (ref 1.7–2.4)

## 2018-11-25 MED ORDER — PNEUMOCOCCAL VAC POLYVALENT 25 MCG/0.5ML IJ INJ
0.5000 mL | INJECTION | INTRAMUSCULAR | Status: AC
  Administered 2018-11-26: 0.5 mL via INTRAMUSCULAR
  Filled 2018-11-25: qty 0.5

## 2018-11-25 NOTE — Progress Notes (Signed)
SATURATION QUALIFICATIONS: (This note is used to comply with regulatory documentation for home oxygen)  Patient Saturations on Room Air at Rest = 95%  Patient Saturations on Room Air while Ambulating = 95%  Patient Saturations on 0 Liters of oxygen while Ambulating = 95%  Please briefly explain why patient needs home oxygen: 

## 2018-11-25 NOTE — Progress Notes (Signed)
Initial Nutrition Assessment  INTERVENTION:   Continue Ensure Enlive po BID, each supplement provides 350 kcal and 20 grams of protein  NUTRITION DIAGNOSIS:   Increased nutrient needs related to (ETOH abuse) as evidenced by estimated needs.  GOAL:   Patient will meet greater than or equal to 90% of their needs  MONITOR:   PO intake, Supplement acceptance, Labs, Weight trends, I & O's  REASON FOR ASSESSMENT:   Malnutrition Screening Tool    ASSESSMENT:   53 y.o. male with history of tobacco and alcohol abuse and history of COPD and hypertension per the patient not on any medication has been having shortness of breath and wheezing generalized body ache difficulty ambulating over the last 1 week. admitted for further observation given the generalized presentation of weakness unable to ambulate with wheezing.  Patient currently homeless. Pt is flu+. Has not been eating well getting sick with the flu. Pt reports drinking 1/5th vodka daily. Pt ate well 2/29, 75-100% of meals. Today he consumed 60% of breakfast. Pt has been ordered Ensure supplements, is drinking them with no issue.  No weight history in records.  Labs reviewed. Medications: Folic acid tablet daily, Multivitamin with minerals daily, Thiamine tablet daily  NUTRITION - FOCUSED PHYSICAL EXAM:  Nutrition focused physical exam shows no sign of depletion of muscle mass or body fat.  Diet Order:   Diet Order            Diet Heart Room service appropriate? Yes; Fluid consistency: Thin  Diet effective now              EDUCATION NEEDS:   No education needs have been identified at this time  Skin:  Skin Assessment: Reviewed RN Assessment  Last BM:  2/29  Height:   Ht Readings from Last 1 Encounters:  11/23/18 6' (1.829 m)    Weight:   Wt Readings from Last 1 Encounters:  11/23/18 90.7 kg    Ideal Body Weight:  80.9 kg  BMI:  Body mass index is 27.12 kg/m.  Estimated Nutritional Needs:   Kcal:   2200-2400  Protein:  105-115g  Fluid:  2.2L/day  Tilda Franco, MS, RD, LDN [facility] Inpatient Clinical Dietitian Pager: [phone] After Hours Pager: [phone]

## 2018-11-25 NOTE — Progress Notes (Signed)
PT Note  Patient Details Name: Joseph Powers MRN: [id number] DOB: 01/13/1966     Noted pt ws able to ambulate around unit with nursing today capturing O2 sats. He was holding onto the railing per nursing, so tomorrow we will check to see if he is continuing to progress with ambulation or if he has any skilled PT needs.   Marella Bile 11/25/2018, 6:09 PM  Marella Bile, PT Acute Rehabilitation Services Pager: [phone] Office: [phone] 11/25/2018

## 2018-11-25 NOTE — Progress Notes (Signed)
PROGRESS NOTE    Joseph Powers  KVQ:[id number] DOB: Mar 13, 1966 DOA: 11/23/2018 PCP: Patient, No Pcp Per   Chief complaint: Generalized body aches   Brief Narrative:   Joseph Powers is a 53 y.o. male with history of tobacco and alcohol abuse and history of COPD and hypertension per the patient not on any medication has been having shortness of breath and wheezing generalized body ache difficulty ambulating over the last 1 week.  Had subjective feeling of fever chills.  Feeling difficult to ambulate because of generalized pain.  ED Course: In the ER patient was found to be wheezing and was placed on nebulizer treatment.  Chest x-ray was unremarkable EKG showed normal sinus rhythm.  Patient was very lethargic and venous blood gas did not show any carbon oxide retention.  Influenza a was positive and patient started on Tamiflu and admitted for further observation given the generalized presentation of weakness unable to ambulate with wheezing.   Assessment & Plan:   Active Problems:   Acute bronchitis   Influenza   Acute hypoxic respiratory failure Influenza A infection Patient presenting with generalized body aches, shortness of breath.  Was afebrile, without leukocytosis.  Chest x-ray negative for acute disease process.  Rapid flu positive for influenza type A.  Was requiring supplemental oxygen to maintain adequate SPO2.  History of COPD, current tobacco abuse not on any home medications.  Not on home oxygen. --Continue Tamiflu 75 mg p.o. twice daily --RT consulted, nebs as needed --Continue to titrate supplemental oxygen to maintain SPO2 greater than 92% --Supportive care  EtOH abuse Patient reports drinks fifth of vodka on a daily basis.  Does report previous withdrawal symptoms to include tremors. --CIWAA protocol --UDS negative --Thiamine, folate, multivitamin --Continue to monitor on telemetry --Social work consult for substance abuse --Counseled on need for total alcohol  cessation --HIV pending  Tobacco abuse disorder Counseled on need for cessation. --Nicotine patch while inpatient  Homelessness Patient reports recently moved from [facility].  Lives in shelters occasionally otherwise lives on the streets. --Social work consult for assistance  Thrombocytopenia Transaminitis Platelet count 88.  AST 124, ALT 79.  No signs of active bleeding.  Etiology likely from underlying alcohol abuse. --Continue to monitor CMP and CBC daily --EtOH cessation advised   DVT prophylaxis: Lovenox Code Status: Full Code Family Communication: None Disposition Plan: Continue inpatient hospitalization for acute hypoxic respiratory failure, on Tamiflu for active influenza type a; PT evaluation, social work evaluation, further depending on clinical course  Severity of Illness: The appropriate patient status for this patient is INPATIENT. Inpatient status is judged to be reasonable and necessary in order to provide the required intensity of service to ensure the patient's safety. The patient's presenting symptoms, physical exam findings, and initial radiographic and laboratory data in the context of their chronic comorbidities is felt to place them at high risk for further clinical deterioration. Furthermore, it is not anticipated that the patient will be medically stable for discharge from the hospital within 2 midnights of admission. The following factors support the patient status of inpatient.   " The patient's presenting symptoms include hypoxia, body aches, generalized weakness/fatigue " The worrisome physical exam findings include decreased breath sounds " The initial radiographic and laboratory data are worrisome because of influenza A infection " The chronic co-morbidities include homelessness, EtOH abuse, hx EtOH withdrawls   * I certify that at the point of admission it is my clinical judgment that the patient will require inpatient hospital care spanning  beyond 2  midnights from the point of admission due to high intensity of service, high risk for further deterioration and high frequency of surveillance required.*   Consultants:   none  Procedures:   none  Antimicrobials:   Tamiflu 2/29>>>   Subjective: Patient seen and examined at bedside, resting comfortably.  Continues with global weakness and fatigue.  Continues with generalized body aches/myalgias, although reports slightly improved since yesterday.  Difficulty ambulating to bathroom.  Poor appetite.  Continues on supplemental oxygen secondary to active influenza infection.  No other complaints at this time. Denies visual changes, no chest pain, no palpitations, no abdominal pain, no fever/chills/night sweats, no issues with bowel/bladder function, no paresthesias, no congestion.  No acute events overnight per nursing staff.  Objective: Vitals:   11/24/18 2029 11/24/18 2059 11/25/18 0428 11/25/18 0704  BP:  132/76 135/86   Pulse:  87 79   Resp:  18 18   Temp:  98 F (36.7 C) 98.3 F (36.8 C)   TempSrc:  Oral Oral   SpO2: 97% 95% 94% 98%  Weight:      Height:        Intake/Output Summary (Last 24 hours) at 11/25/2018 0925 Last data filed at 11/25/2018 [id number] Gross per 24 hour  Intake 3508.47 ml  Output 2850 ml  Net 658.47 ml   Filed Weights   11/23/18 2125  Weight: 90.7 kg    Examination:  General exam: Appears calm and comfortable  Respiratory system: Lung sounds slightly decreased bilateral bases, no crackles, mild late expiratory wheezing, normal respiratory effort, on 2 L nasal cannula. Cardiovascular system: S1 & S2 heard, RRR. No JVD, murmurs, rubs, gallops or clicks. No pedal edema. Gastrointestinal system: Abdomen is nondistended, soft and nontender. No organomegaly or masses felt. Normal bowel sounds heard. Central nervous system: Alert and oriented. No focal neurological deficits. Extremities: Symmetric 5 x 5 power. Skin: No rashes, lesions or ulcers Psychiatry:  Judgement and insight appear normal. Mood & affect appropriate.     Data Reviewed: I have personally reviewed following labs and imaging studies  CBC: Recent Labs  Lab 11/23/18 2201 11/23/18 2334 11/24/18 0308 11/25/18 0504  WBC 3.3*  --  2.3* 3.6*  NEUTROABS  --   --  1.3*  --   HGB 12.4* 12.2* 12.3* 12.4*  HCT 38.3* 36.0* 39.8 39.6  MCV 89.3  --  91.9 91.5  PLT 87*  --  78* 68*   Basic Metabolic Panel: Recent Labs  Lab 11/23/18 2201 11/23/18 2334 11/24/18 0308 11/25/18 0504  NA 134* 136 137 140  K 3.6 3.4* 4.1 3.9  CL 101  --  104 106  CO2 19*  --  25 27  GLUCOSE 102*  --  112* 112*  BUN 18  --  18 16  CREATININE 1.03  --  0.91 0.82  CALCIUM 8.0*  --  8.4* 8.0*  MG  --   --  2.4 2.2   GFR: Estimated Creatinine Clearance: 113 mL/min (by C-G formula based on SCr of 0.82 mg/dL). Liver Function Tests: Recent Labs  Lab 11/24/18 0308 11/25/18 0504  AST 124* 73*  ALT 79* 61*  ALKPHOS 48 46  BILITOT 0.7 0.4  PROT 6.7 6.1*  ALBUMIN 3.8 3.3*   No results for input(s): LIPASE, AMYLASE in the last 168 hours. No results for input(s): AMMONIA in the last 168 hours. Coagulation Profile: No results for input(s): INR, PROTIME in the last 168 hours. Cardiac Enzymes: Recent Labs  Lab  11/24/18 0308 11/24/18 0740  CKTOTAL 416*  --   TROPONINI  --  <0.03   BNP (last 3 results) No results for input(s): PROBNP in the last 8760 hours. HbA1C: No results for input(s): HGBA1C in the last 72 hours. CBG: No results for input(s): GLUCAP in the last 168 hours. Lipid Profile: No results for input(s): CHOL, HDL, LDLCALC, TRIG, CHOLHDL, LDLDIRECT in the last 72 hours. Thyroid Function Tests: No results for input(s): TSH, T4TOTAL, FREET4, T3FREE, THYROIDAB in the last 72 hours. Anemia Panel: No results for input(s): VITAMINB12, FOLATE, FERRITIN, TIBC, IRON, RETICCTPCT in the last 72 hours. Sepsis Labs: No results for input(s): PROCALCITON, LATICACIDVEN in the last 168  hours.  No results found for this or any previous visit (from the past 240 hour(s)).       Radiology Studies: Dg Chest 2 View  Result Date: 11/23/2018 CLINICAL DATA:  53 year old male with cough. EXAM: CHEST - 2 VIEW COMPARISON:  None. FINDINGS: Background of COPD and mild chronic bronchitic changes. No focal consolidation, pleural effusion or pneumothorax. The cardiac silhouette is within normal limits. No acute osseous pathology. Lower cervical ACDF. IMPRESSION: No active cardiopulmonary disease. Electronically Signed   By: Elgie Collard M.D.   On: 11/23/2018 22:06        Scheduled Meds: . enoxaparin (LOVENOX) injection  40 mg Subcutaneous Q24H  . feeding supplement (ENSURE ENLIVE)  237 mL Oral BID BM  . folic acid  1 mg Oral Daily  . Influenza vac split quadrivalent PF  0.5 mL Intramuscular Tomorrow-1000  . ipratropium-albuterol  3 mL Nebulization TID  . multivitamin with minerals  1 tablet Oral Daily  . nicotine  21 mg Transdermal Daily  . oseltamivir  75 mg Oral BID  . [START ON 11/26/2018] pneumococcal 23 valent vaccine  0.5 mL Intramuscular Tomorrow-1000  . thiamine  100 mg Oral Daily   Or  . thiamine  100 mg Intravenous Daily   Continuous Infusions:    LOS: 0 days    Time spent: 29 minutes    Alvira Philips [place], DO [facility] Pager [phone]  If 7PM-7AM, please contact night-coverage [email or web address] Password [email or web address] 11/25/2018, 9:25 AM

## 2018-11-26 DIAGNOSIS — J205 Acute bronchitis due to respiratory syncytial virus: Secondary | ICD-10-CM

## 2018-11-26 DIAGNOSIS — J9601 Acute respiratory failure with hypoxia: Secondary | ICD-10-CM

## 2018-11-26 LAB — COMPREHENSIVE METABOLIC PANEL
ALT: 58 U/L — ABNORMAL HIGH (ref 0–44)
AST: 63 U/L — ABNORMAL HIGH (ref 15–41)
Albumin: 3.3 g/dL — ABNORMAL LOW (ref 3.5–5.0)
Alkaline Phosphatase: 49 U/L (ref 38–126)
Anion gap: 8 (ref 5–15)
BUN: 15 mg/dL (ref 6–20)
CO2: 27 mmol/L (ref 22–32)
Calcium: 8.1 mg/dL — ABNORMAL LOW (ref 8.9–10.3)
Chloride: 102 mmol/L (ref 98–111)
Creatinine, Ser: 0.82 mg/dL (ref 0.61–1.24)
GFR calc Af Amer: >60 mL/min (ref >60)
GFR calc non Af Amer: >60 mL/min (ref >60)
Glucose, Bld: 110 mg/dL — ABNORMAL HIGH (ref 70–99)
Potassium: 3.7 mmol/L (ref 3.5–5.1)
SODIUM: 137 mmol/L (ref 135–145)
Total Bilirubin: 0.4 mg/dL (ref 0.3–1.2)
Total Protein: 6.1 g/dL — ABNORMAL LOW (ref 6.5–8.1)

## 2018-11-26 LAB — CBC
HCT: 41 % (ref 39.0–52.0)
Hemoglobin: 12.6 g/dL — ABNORMAL LOW (ref 13.0–17.0)
MCH: 28.3 pg (ref 26.0–34.0)
MCHC: 30.7 g/dL (ref 30.0–36.0)
MCV: 92.1 fL (ref 80.0–100.0)
Platelets: 96 10*3/uL — ABNORMAL LOW (ref 150–400)
RBC: 4.45 MIL/uL (ref 4.22–5.81)
RDW: 14.7 % (ref 11.5–15.5)
WBC: 2.9 10*3/uL — ABNORMAL LOW (ref 4.0–10.5)
nRBC: 0 % (ref 0.0–0.2)

## 2018-11-26 LAB — MAGNESIUM: Magnesium: 2.1 mg/dL (ref 1.7–2.4)

## 2018-11-26 MED ORDER — THIAMINE HCL 100 MG PO TABS: 100.0000 mg | ORAL_TABLET | Freq: Every day | ORAL | 0 refills | Status: AC

## 2018-11-26 MED ORDER — OSELTAMIVIR PHOSPHATE 75 MG PO CAPS: 75.0000 mg | ORAL_CAPSULE | Freq: Two times a day (BID) | ORAL | 0 refills | Status: DC

## 2018-11-26 MED ORDER — ALBUTEROL SULFATE HFA 108 (90 BASE) MCG/ACT IN AERS: 2.0000 | INHALATION_SPRAY | Freq: Four times a day (QID) | RESPIRATORY_TRACT | 2 refills | Status: AC | PRN

## 2018-11-26 MED ORDER — OSELTAMIVIR PHOSPHATE 75 MG PO CAPS: 75.0000 mg | ORAL_CAPSULE | Freq: Two times a day (BID) | ORAL | 0 refills | Status: AC

## 2018-11-26 MED ORDER — NICOTINE 21 MG/24HR TD PT24: 21.0000 mg | MEDICATED_PATCH | Freq: Every day | TRANSDERMAL | 0 refills | Status: AC

## 2018-11-26 MED ORDER — NICOTINE 21 MG/24HR TD PT24: 21.0000 mg | MEDICATED_PATCH | Freq: Every day | TRANSDERMAL | 0 refills | Status: DC

## 2018-11-26 MED ORDER — FOLIC ACID 1 MG PO TABS: 1.0000 mg | ORAL_TABLET | Freq: Every day | ORAL | 1 refills | Status: DC

## 2018-11-26 MED ORDER — FOLIC ACID 1 MG PO TABS: 1.0000 mg | ORAL_TABLET | Freq: Every day | ORAL | 1 refills | Status: AC

## 2018-11-26 MED ORDER — THIAMINE HCL 100 MG PO TABS: 100.0000 mg | ORAL_TABLET | Freq: Every day | ORAL | 0 refills | Status: DC

## 2018-11-26 NOTE — Progress Notes (Signed)
   11/26/18 1200  Clinical Encounter Type  Visited With Patient  Visit Type Initial;Psychological support;Spiritual support  Referral From Nurse;Social work  Consult/Referral To Chaplain  Spiritual Encounters  Spiritual Needs Emotional;Other (Comment) (Clothing )  Stress Factors  Patient Stress Factors Financial concerns   I provided the patient with clothing per request through the social worker.     Chaplain Clint Bolder M.Div., [facility]

## 2018-11-26 NOTE — Evaluation (Signed)
Physical Therapy Evaluation Patient Details Name: Joseph Powers MRN: [id number] DOB: [birth year] Today's Date: 11/26/2018   History of Present Illness  Joseph Powers is a 53 y.o. male with history of tobacco and alcohol abuse and history of COPD and hypertension per the patient not on any medication has been having shortness of breath and wheezing generalized body ache difficulty ambulating over the last 1 week.  + for flu A. Patient is homeless.  Clinical Impression  The patient ambulated x 6" with O2 saturation  94%. Patient does not require further skilled PT.  Patient could benefit from Single point cane.  Patient requesting  A bus pass and clothes as patient is homeless.    Follow Up Recommendations No PT follow up    Equipment Recommendations  Cane    Recommendations for Other Services       Precautions / Restrictions Precautions Precautions: None      Mobility  Bed Mobility Overal bed mobility: Independent                Transfers Overall transfer level: Independent Equipment used: None                Ambulation/Gait Ambulation/Gait assistance: Supervision Gait Distance (Feet): 400 Feet(6 minutes) Assistive device: Rolling walker (2 wheeled);None Gait Pattern/deviations: Step-through pattern;Antalgic Gait velocity: decreased   General Gait Details: limps on right leg, at times did use rail when not using RW.  Stairs            Wheelchair Mobility    Modified Rankin (Stroke Patients Only)       Balance Overall balance assessment: Modified Independent                                           Pertinent Vitals/Pain Pain Assessment: Faces Faces Pain Scale: Hurts little more Pain Location: right leg,body aches Pain Descriptors / Indicators: Aching Pain Intervention(s): Monitored during session    Home Living Family/patient expects to be discharged to:: Shelter/Homeless                      Prior Function  Level of Independence: Independent               Hand Dominance        Extremity/Trunk Assessment   Upper Extremity Assessment Upper Extremity Assessment: Overall WFL for tasks assessed    Lower Extremity Assessment Lower Extremity Assessment: RLE deficits/detail RLE Deficits / Details: noted antalgic gait    Cervical / Trunk Assessment Cervical / Trunk Assessment: Normal  Communication   Communication: No difficulties  Cognition Arousal/Alertness: Awake/alert Behavior During Therapy: WFL for tasks assessed/performed Overall Cognitive Status: Within Functional Limits for tasks assessed                                 General Comments: although pateitn stated that he did not know what day it is      General Comments      Exercises     Assessment/Plan    PT Assessment Patent does not need any further PT services  PT Problem List         PT Treatment Interventions      PT Goals (Current goals can be found in the Care Plan section)  Acute Rehab PT Goals Patient Stated Goal:  to get a bus pass to [facility]. PT Goal Formulation: All assessment and education complete, DC therapy    Frequency     Barriers to discharge        Co-evaluation               AM-PAC PT "6 Clicks" Mobility  Outcome Measure Help needed turning from your back to your side while in a flat bed without using bedrails?: None Help needed moving from lying on your back to sitting on the side of a flat bed without using bedrails?: None Help needed moving to and from a bed to a chair (including a wheelchair)?: None Help needed standing up from a chair using your arms (e.g., wheelchair or bedside chair)?: None Help needed to walk in hospital room?: A Little Help needed climbing 3-5 steps with a railing? : A Little 6 Click Score: 22    End of Session   Activity Tolerance: Patient tolerated treatment well Patient left: in bed Nurse Communication: Mobility  status PT Visit Diagnosis: Unsteadiness on feet (R26.81)    Time: 5027-7412 PT Time Calculation (min) (ACUTE ONLY): 21 min   Charges:   PT Evaluation $PT Eval Low Complexity: 1 Low          Blanchard Kelch PT Acute Rehabilitation Services Pager [phone] Office [phone]   Rada Hay 11/26/2018, 10:06 AM

## 2018-11-26 NOTE — Discharge Summary (Signed)
Physician Discharge Summary  Nai Matel MRN: [id number] DOB/AGE: December 12, 1965 53 y.o.  PCP: Patient, No Pcp Per   Admit date: 11/23/2018 Discharge date: 11/26/2018  Discharge Diagnoses:    Active Problems:   Acute bronchitis   Influenza   Acute respiratory failure with hypoxia (HCC)   Homeless   Chronic obstructive pulmonary disease (HCC)   Tobacco abuse    Follow-up recommendations Follow-up with PCP in 3-5 days , including all  additional recommended appointments as below Follow-up CBC, CMP in 3-5 days       Allergies as of 11/26/2018   No Known Allergies     Medication List    TAKE these medications   albuterol 108 (90 Base) MCG/ACT inhaler Commonly known as:  PROVENTIL HFA;VENTOLIN HFA Inhale 2 puffs into the lungs every 6 (six) hours as needed for wheezing or shortness of breath.   folic acid 1 MG tablet Commonly known as:  FOLVITE Take 1 tablet (1 mg total) by mouth daily.   nicotine 21 mg/24hr patch Commonly known as:  NICODERM CQ - dosed in mg/24 hours Place 1 patch (21 mg total) onto the skin daily.   oseltamivir 75 MG capsule Commonly known as:  TAMIFLU Take 1 capsule (75 mg total) by mouth 2 (two) times daily.   thiamine 100 MG tablet Take 1 tablet (100 mg total) by mouth daily.        Discharge Condition:  Stable  Discharge Instructions Get Medicines reviewed and adjusted: Please take all your medications with you for your next visit with your Primary MD  Please request your Primary MD to go over all hospital tests and procedure/radiological results at the follow up, please ask your Primary MD to get all Hospital records sent to his/her office.  If you experience worsening of your admission symptoms, develop shortness of breath, life threatening emergency, suicidal or homicidal thoughts you must seek medical attention immediately by calling 911 or calling your MD immediately  if symptoms less severe.  You must read complete  instructions/literature along with all the possible adverse reactions/side effects for all the Medicines you take and that have been prescribed to you. Take any new Medicines after you have completely understood and accpet all the possible adverse reactions/side effects.   Do not drive when taking Pain medications.   Do not take more than prescribed Pain, Sleep and Anxiety Medications  Special Instructions: If you have smoked or chewed Tobacco  in the last 2 yrs please stop smoking, stop any regular Alcohol  and or any Recreational drug use.  Wear Seat belts while driving.  Please note  You were cared for by a hospitalist during your hospital stay. Once you are discharged, your primary care physician will handle any further medical issues. Please note that NO REFILLS for any discharge medications will be authorized once you are discharged, as it is imperative that you return to your primary care physician (or establish a relationship with a primary care physician if you do not have one) for your aftercare needs so that they can reassess your need for medications and monitor your lab values.     No Known Allergies    Disposition: Discharge disposition: 01-Home or Self Care        Consults:  none    Significant Diagnostic Studies:  Dg Chest 2 View  Result Date: 11/23/2018 CLINICAL DATA:  53 year old male with cough. EXAM: CHEST - 2 VIEW COMPARISON:  None. FINDINGS: Background of COPD and mild chronic bronchitic changes.  No focal consolidation, pleural effusion or pneumothorax. The cardiac silhouette is within normal limits. No acute osseous pathology. Lower cervical ACDF. IMPRESSION: No active cardiopulmonary disease. Electronically Signed   By: Elgie Collard M.D.   On: 11/23/2018 22:06      Filed Weights   11/23/18 2125  Weight: 90.7 kg     Microbiology: No results found for this or any previous visit (from the past 240 hour(s)).     Blood Culture No results  found for: SDES, SPECREQUEST, CULT, REPTSTATUS    Labs: Results for orders placed or performed during the hospital encounter of 11/23/18 (from the past 48 hour(s))  CBC     Status: Abnormal   Collection Time: 11/25/18  5:04 AM  Result Value Ref Range   WBC 3.6 (L) 4.0 - 10.5 K/uL   RBC 4.33 4.22 - 5.81 MIL/uL   Hemoglobin 12.4 (L) 13.0 - 17.0 g/dL   HCT 01.0 27.2 - 53.6 %   MCV 91.5 80.0 - 100.0 fL   MCH 28.6 26.0 - 34.0 pg   MCHC 31.3 30.0 - 36.0 g/dL   RDW 64.4 03.4 - 74.2 %   Platelets 68 (L) 150 - 400 K/uL    Comment: REPEATED TO VERIFY Immature Platelet Fraction may be clinically indicated, consider ordering this additional test [id number] CONSISTENT WITH PREVIOUS RESULT    nRBC 0.0 0.0 - 0.2 %    Comment: Performed at [facility], [address] [place], [place] [address]  Comprehensive metabolic panel     Status: Abnormal   Collection Time: 11/25/18  5:04 AM  Result Value Ref Range   Sodium 140 135 - 145 mmol/L   Potassium 3.9 3.5 - 5.1 mmol/L   Chloride 106 98 - 111 mmol/L   CO2 27 22 - 32 mmol/L   Glucose, Bld 112 (H) 70 - 99 mg/dL   BUN 16 6 - 20 mg/dL   Creatinine, Ser 9.51 0.61 - 1.24 mg/dL   Calcium 8.0 (L) 8.9 - 10.3 mg/dL   Total Protein 6.1 (L) 6.5 - 8.1 g/dL   Albumin 3.3 (L) 3.5 - 5.0 g/dL   AST 73 (H) 15 - 41 U/L   ALT 61 (H) 0 - 44 U/L   Alkaline Phosphatase 46 38 - 126 U/L   Total Bilirubin 0.4 0.3 - 1.2 mg/dL   GFR calc non Af Amer >60 >60 mL/min   GFR calc Af Amer >60 >60 mL/min   Anion gap 7 5 - 15    Comment: Performed at [facility], [address], [place], [place] [address]  Magnesium     Status: None   Collection Time: 11/25/18  5:04 AM  Result Value Ref Range   Magnesium 2.2 1.7 - 2.4 mg/dL    Comment: Performed at [facility], [address], [place], [place] [address]  CBC     Status: Abnormal   Collection Time: 11/26/18  4:28 AM  Result Value Ref Range   WBC 2.9 (L) 4.0  - 10.5 K/uL   RBC 4.45 4.22 - 5.81 MIL/uL   Hemoglobin 12.6 (L) 13.0 - 17.0 g/dL   HCT 16.0 10.9 - 32.3 %   MCV 92.1 80.0 - 100.0 fL   MCH 28.3 26.0 - 34.0 pg   MCHC 30.7 30.0 - 36.0 g/dL   RDW 55.7 32.2 - 02.5 %   Platelets 96 (L) 150 - 400 K/uL    Comment: REPEATED TO VERIFY Immature Platelet Fraction may  be clinically indicated, consider ordering this additional test [id number] CONSISTENT WITH PREVIOUS RESULT    nRBC 0.0 0.0 - 0.2 %    Comment: Performed at [facility], [address], [place], [place] [address]  Comprehensive metabolic panel     Status: Abnormal   Collection Time: 11/26/18  4:28 AM  Result Value Ref Range   Sodium 137 135 - 145 mmol/L   Potassium 3.7 3.5 - 5.1 mmol/L   Chloride 102 98 - 111 mmol/L   CO2 27 22 - 32 mmol/L   Glucose, Bld 110 (H) 70 - 99 mg/dL   BUN 15 6 - 20 mg/dL   Creatinine, Ser 7.82 0.61 - 1.24 mg/dL   Calcium 8.1 (L) 8.9 - 10.3 mg/dL   Total Protein 6.1 (L) 6.5 - 8.1 g/dL   Albumin 3.3 (L) 3.5 - 5.0 g/dL   AST 63 (H) 15 - 41 U/L   ALT 58 (H) 0 - 44 U/L   Alkaline Phosphatase 49 38 - 126 U/L   Total Bilirubin 0.4 0.3 - 1.2 mg/dL   GFR calc non Af Amer >60 >60 mL/min   GFR calc Af Amer >60 >60 mL/min   Anion gap 8 5 - 15    Comment: Performed at [facility], [address], [place], [place] [address]  Magnesium     Status: None   Collection Time: 11/26/18  4:28 AM  Result Value Ref Range   Magnesium 2.1 1.7 - 2.4 mg/dL    Comment: Performed at [facility], [address] [place], [place] [address]     Lipid Panel  No results found for: CHOL, TRIG, HDL, CHOLHDL, VLDL, LDLCALC, LDLDIRECT   No results found for: HGBA1C   Lab Results  Component Value Date   CREATININE 0.82 11/26/2018     HPI   Joseph Powers is a 53 y.o. male with history of tobacco and alcohol abuse and history of COPD and hypertension per the patient not on any medication has been having  shortness of breath and wheezing generalized body ache difficulty ambulating over the last 1 week.  Had subjective feeling of fever chills.  Feeling difficult to ambulate because of generalized pain.  ED Course: In the ER patient was found to be wheezing and was placed on nebulizer treatment.  Chest x-ray was unremarkable EKG showed normal sinus rhythm.  Patient was very lethargic and venous blood gas did not show any carbon oxide retention.  Influenza a was positive and patient started on Tamiflu and admitted for further observation given the generalized presentation of weakness unable to ambulate with wheezing.   HOSPITAL COURSE:   Acute hypoxic respiratory failure Influenza A infection Patient presenting with generalized body aches, shortness of breath.  Was afebrile, without leukocytosis.  Chest x-ray negative for acute disease process.  Rapid flu positive for influenza type A.  Was requiring supplemental oxygen to maintain adequate SPO2.  currently the patient is 95% on room air with ambulation. History of COPD, current tobacco abuse not on any home medications.  Not on home oxygen. --Continue Tamiflu 75 mg p.o. twice daily to complete a total of 5 days --no oxygen needed at discharge    EtOH abuse Patient reports drinks fifth of vodka on a daily basis.  Does report previous withdrawal symptoms to include tremors. --CIWAA protocol , not scoring --UDS negative --Thiamine, folate, multivitamin --telemetry uneventful --Social work consult for substance abuse --Counseled on need for total alcohol cessation --HIV  Tobacco abuse disorder   Counseled on need for cessation. --Nicotine patch while inpatient  Hom[place]ness [place]  Lives in shelters occasionally otherwise lives on the streets. --Social work consult for assistance  Thrombocytopenia Transaminitis Platelet count  96 at the time of discharge.  AST 124, ALT 79. Liver function  improving, AST 63, ALT 58, bilirubin 0.4 on the day of discharge.  No signs of active bleeding.  Etiology likely from underlying alcohol abuse. --EtOH cessation advised     Discharge Exam:  Blood pressure (!) 160/98, pulse 75, temperature 97.9 F (36.6 C), temperature source Oral, resp. rate 18, height 6' (1.829 m), weight 90.7 kg, SpO2 90 %.  Cardiovascular: S1-S2 heard. Abdomen: Soft nontender bowel sounds present. Musculoskeletal: No edema.  No joint effusion. Skin: No rash. Neurologic: Patient is very lethargic but answers questions appropriately and is oriented time place and person.  Moves all extremities but has some difficulty moving extremities.    Follow-up Information    primary care provider. Call.   Why:  please call your primary care provider in 3-5 days ,and set hospital follow-up appointment          Signed: Richarda Overlieayana Korrin Waterfield 11/26/2018, 11:56 AM      Time needed to  prepare  discharge, discussed with the patient and family 35 minutes

## 2018-11-26 NOTE — Progress Notes (Signed)
Spoke with pt concerning his medications. Pt states that he just don't take them. Pt has Medicaid unable to assist with meds. Pt is homeless will give him a list of shelters. Pt also states that he need a change of clothes.

## 2019-09-27 IMAGING — CR DG CHEST 2V
2 series · 2 of 2 positions shown · non-contrast
Comparison: None.

CLINICAL DATA: 53-year-old male with cough.

EXAM:
CHEST - 2 VIEW

[w chest pa]
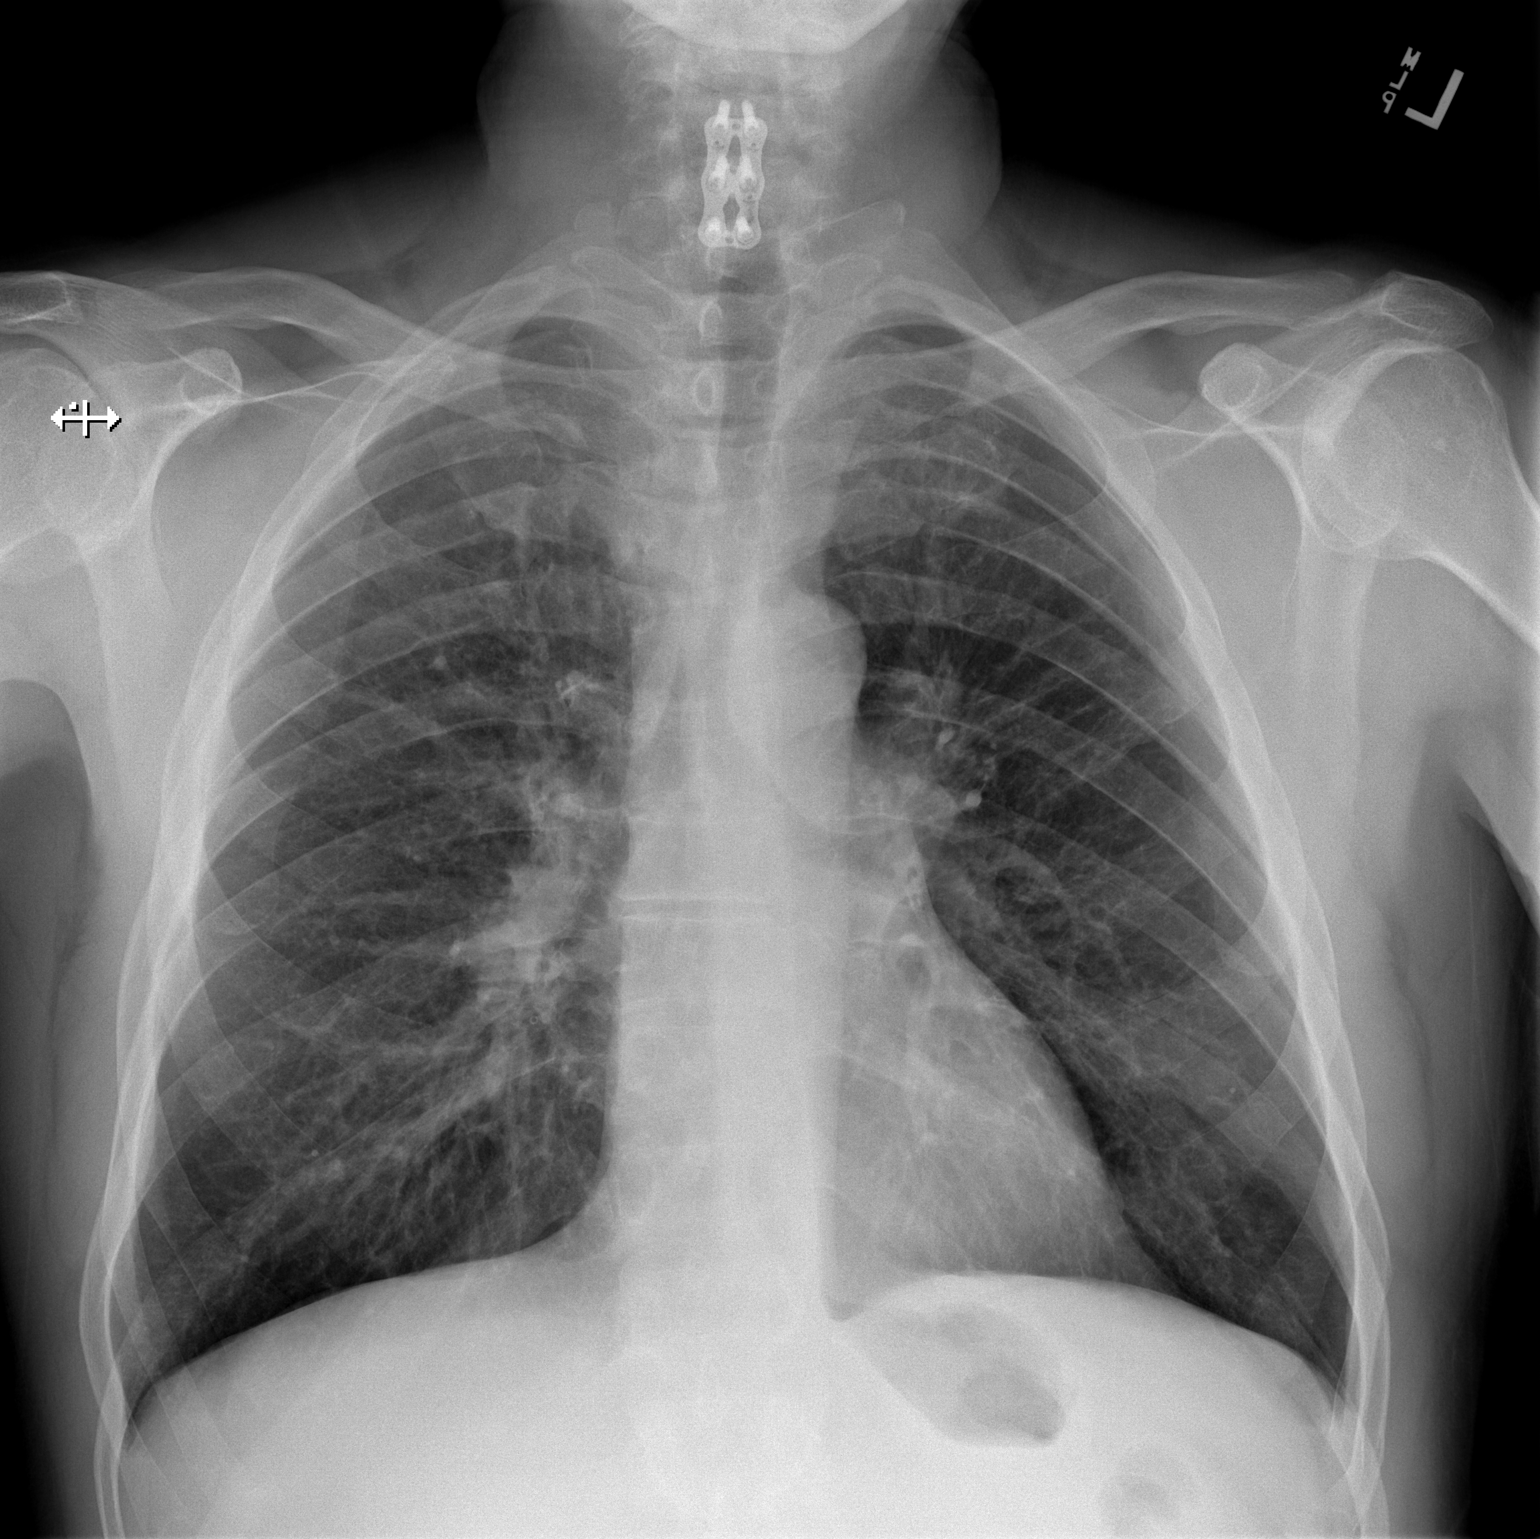

[w chest lat]
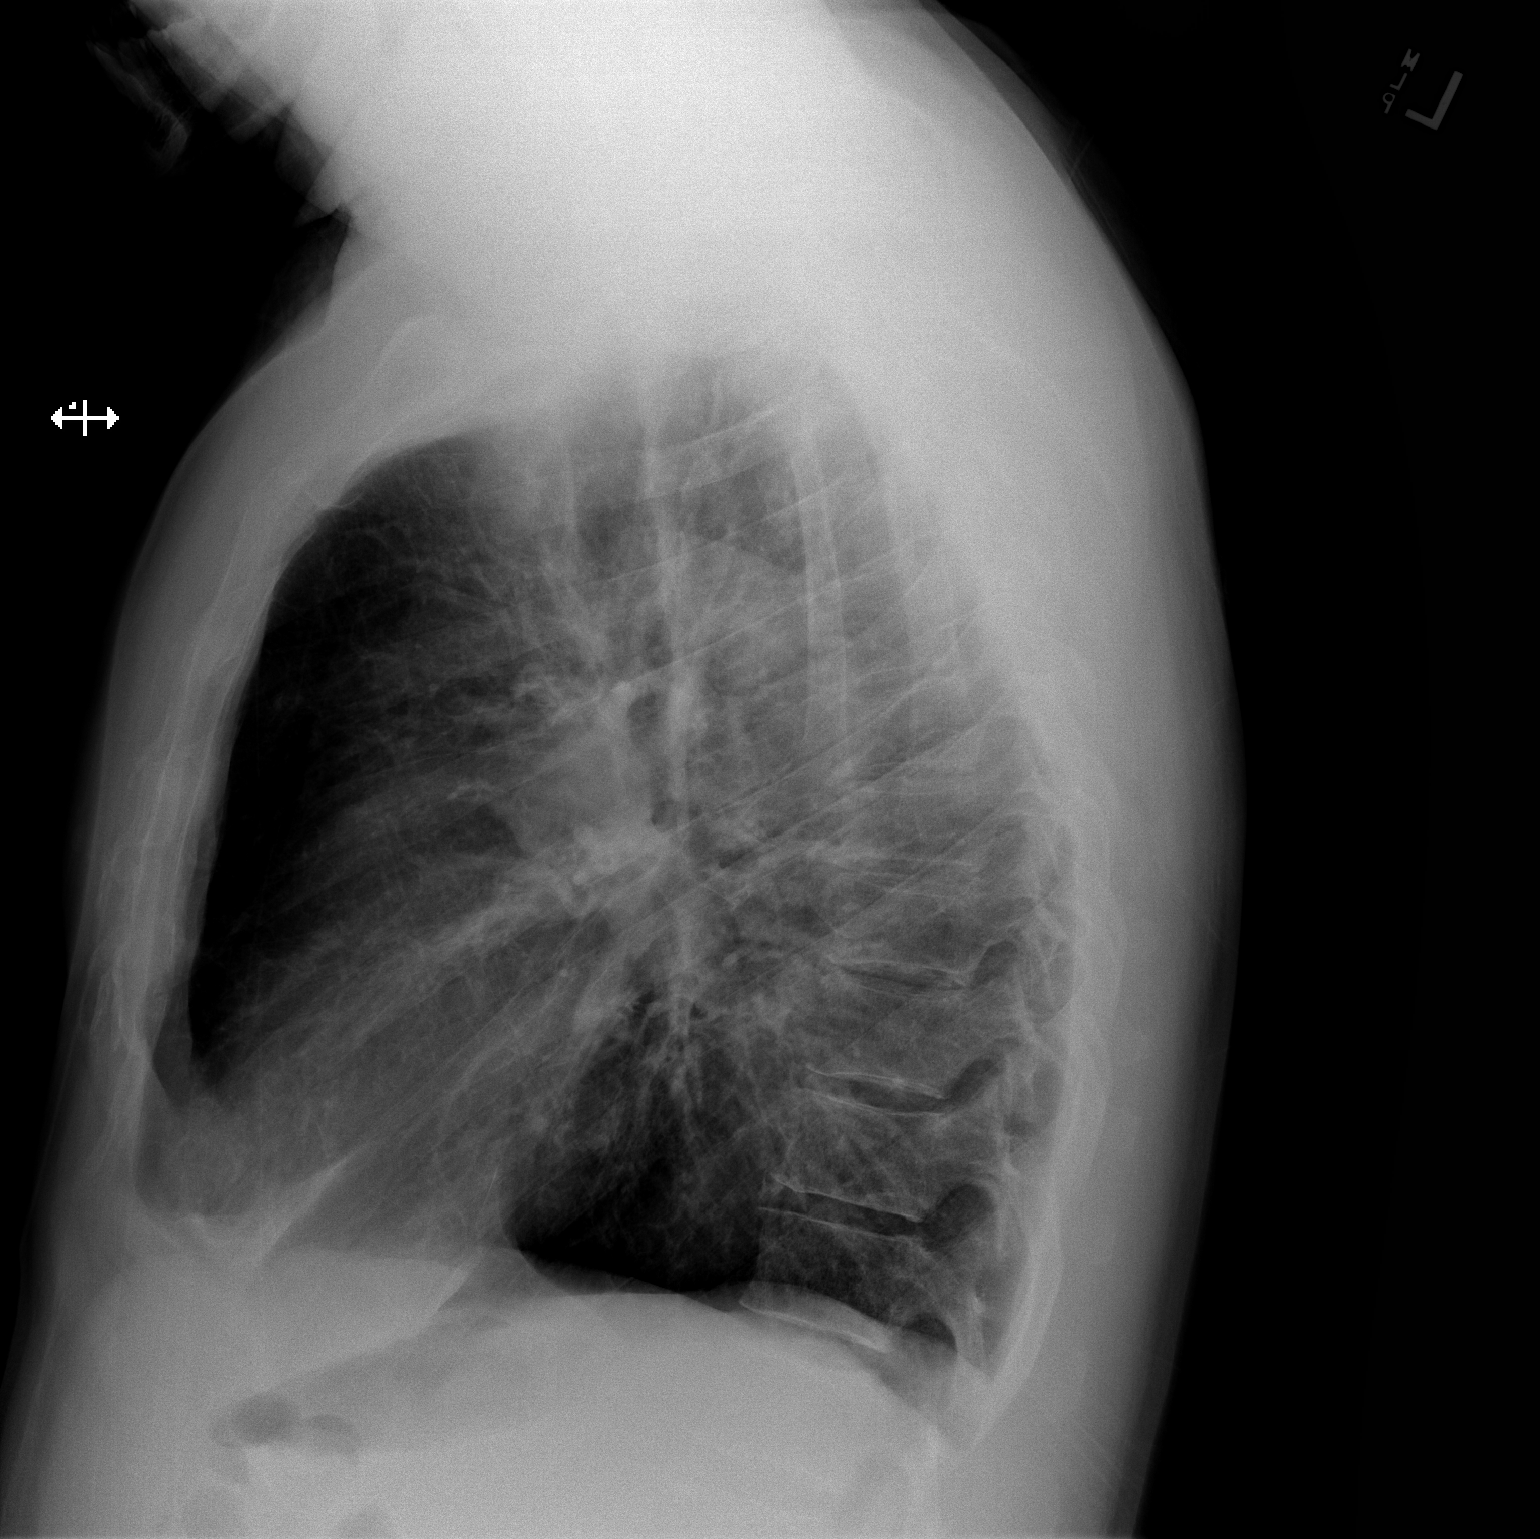

[2 of 2 positions shown; findings below may reference images not displayed]

FINDINGS: Background of COPD and mild chronic bronchitic changes. No focal
consolidation, pleural effusion or pneumothorax. The cardiac
silhouette is within normal limits. No acute osseous pathology.
Lower cervical ACDF.
IMPRESSION: No active cardiopulmonary disease.
